# Patient Record
Sex: Male | Born: 1960 | Race: Black or African American | Hispanic: No | Marital: Single | Smoking: Never smoker
Health system: Southern US, Community
[De-identification: ages and names within clinical notes are randomized; demographics above are authoritative.]

## PROBLEM LIST (undated history)

## (undated) DIAGNOSIS — I10 Essential (primary) hypertension: Secondary | ICD-10-CM

## (undated) DIAGNOSIS — E785 Hyperlipidemia, unspecified: Secondary | ICD-10-CM

## (undated) DIAGNOSIS — K219 Gastro-esophageal reflux disease without esophagitis: Secondary | ICD-10-CM

## (undated) HISTORY — DX: Essential (primary) hypertension: I10

## (undated) HISTORY — DX: Gastro-esophageal reflux disease without esophagitis: K21.9

## (undated) HISTORY — DX: Hyperlipidemia, unspecified: E78.5

---

## 2005-05-15 ENCOUNTER — Ambulatory Visit (HOSPITAL_COMMUNITY): Admission: RE | Admit: 2005-05-15 | Discharge: 2005-05-15 | Payer: Self-pay | Admitting: Family Medicine

## 2008-10-01 ENCOUNTER — Ambulatory Visit (HOSPITAL_COMMUNITY): Admission: RE | Admit: 2008-10-01 | Discharge: 2008-10-01 | Payer: Self-pay | Admitting: Urology

## 2010-12-18 IMAGING — US US ART/VEN ABD/PELV/SCROTUM DOPPLER COMPLETE
1 series · 14 of 25 positions shown · non-contrast
Comparison: None

CLINICAL DATA: Right scrotal pain for 6 months.  Question
epididymitis.

SCROTAL ULTRASOUND
DOPPLER ULTRASOUND OF THE TESTICLES
TECHNIQUE: Complete ultrasound examination of the testicles,
epididymis, and other scrotal structures was performed.  Color and
spectral Doppler ultrasound were also utilized to evaluate blood
flow to the testicles.

[Series 1: unknown · 0.11mm/px · 14 of 38 slices shown]
[im 1/38]
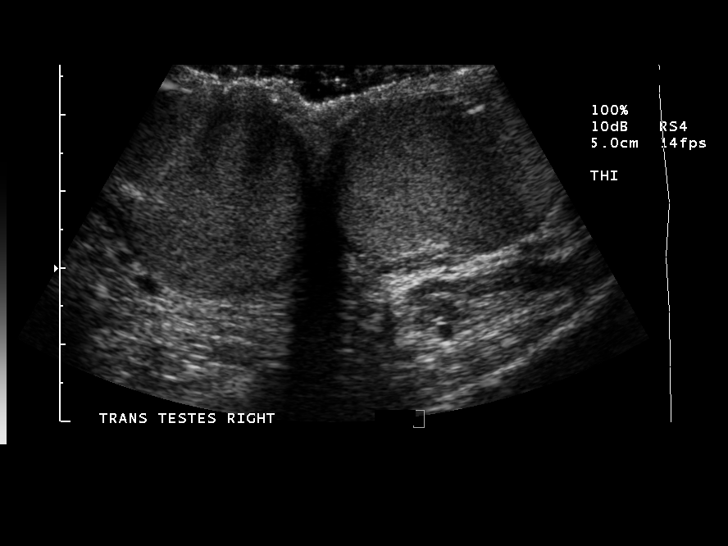
[im 4/38]
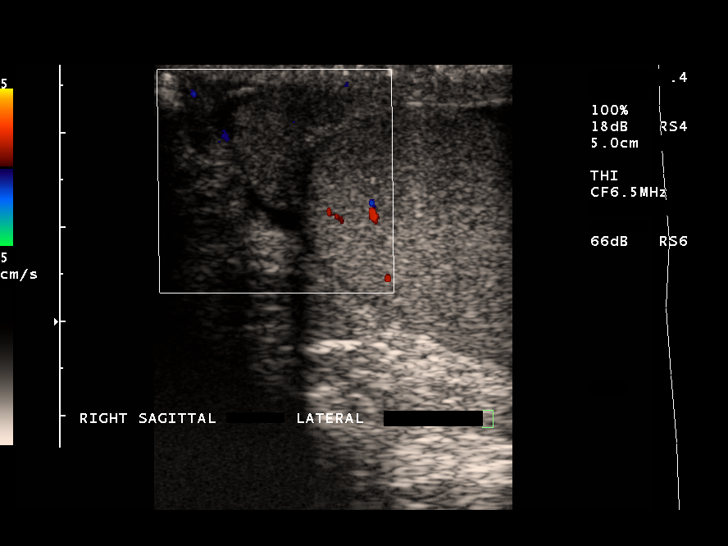
[im 7/38]
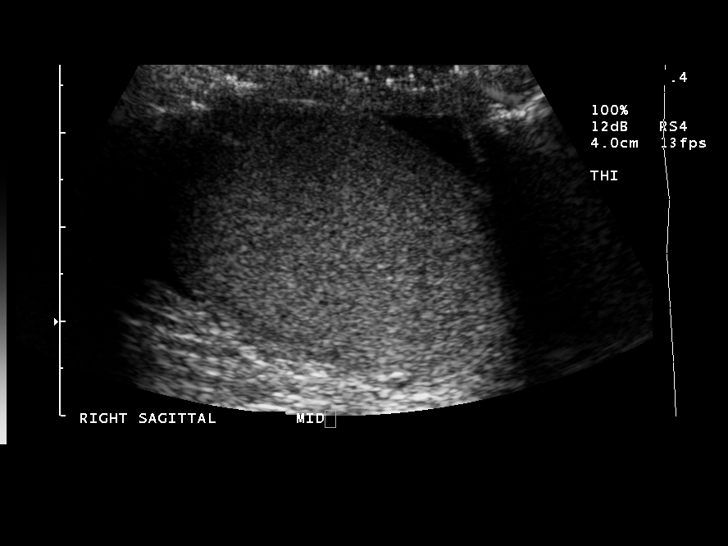
[im 10/38]
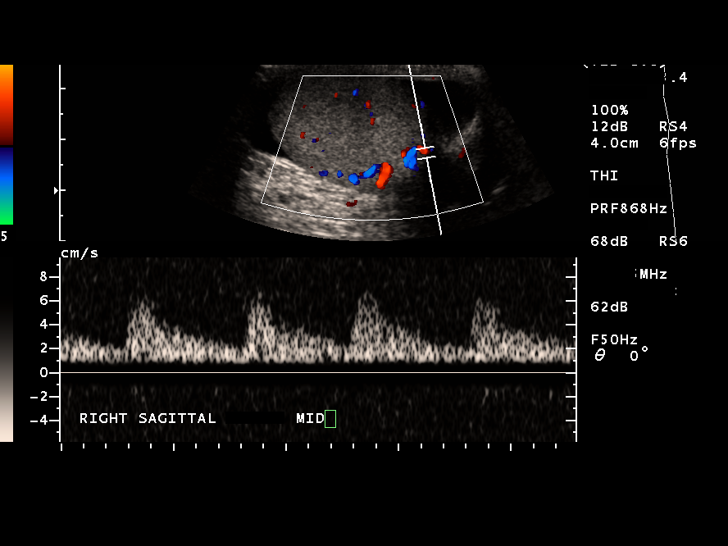
[im 13/38]
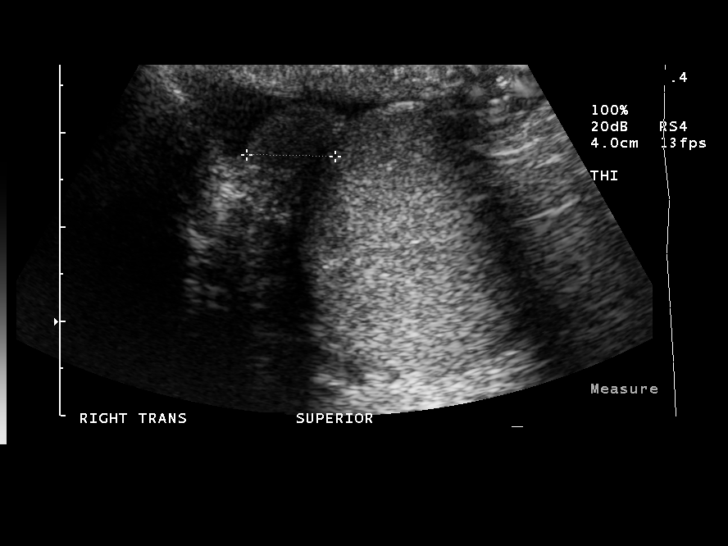
[im 14/38]
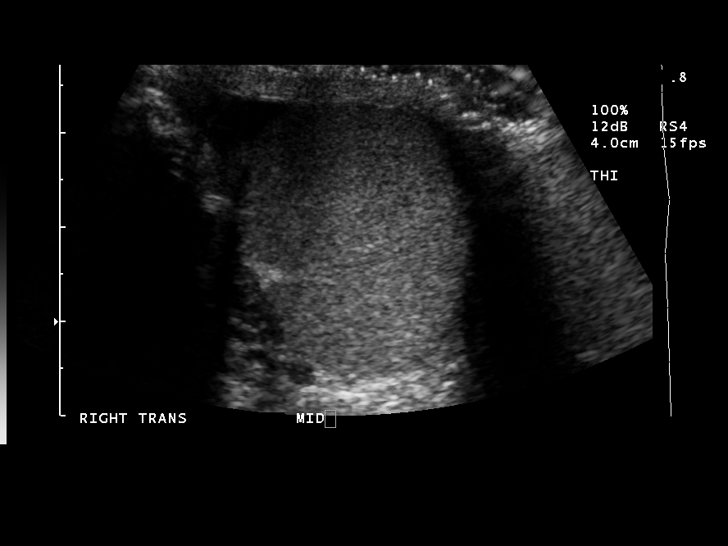
[im 17/38]
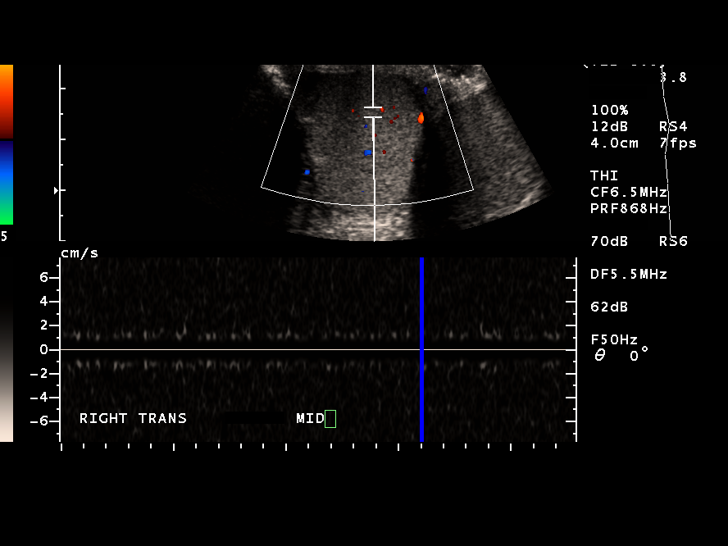
[im 21/38]
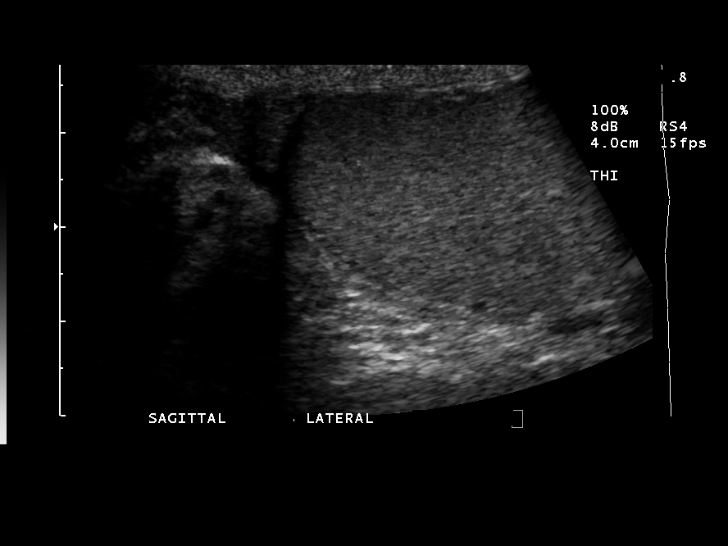
[im 24/38]
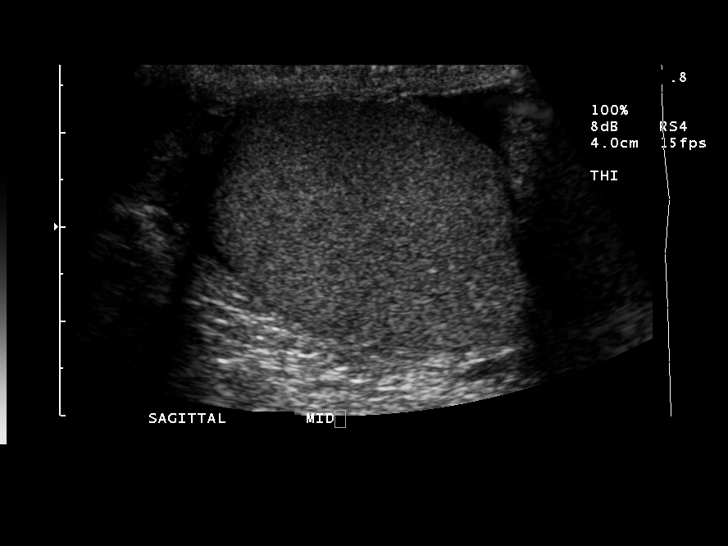
[im 25/38]
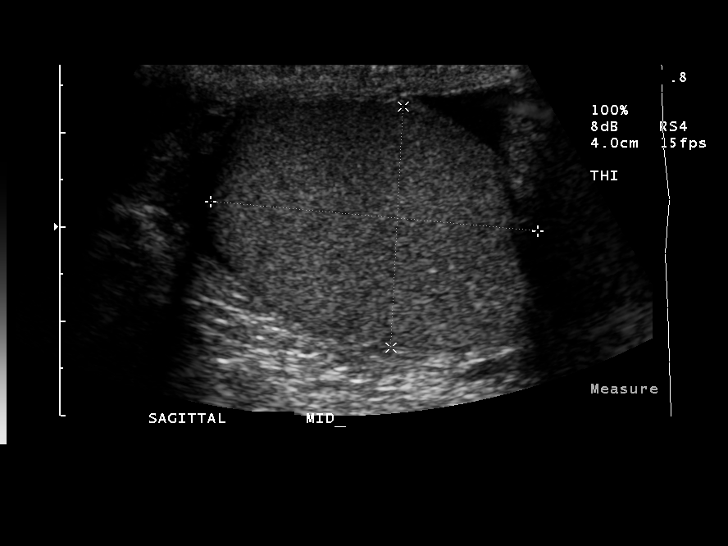
[im 28/38]
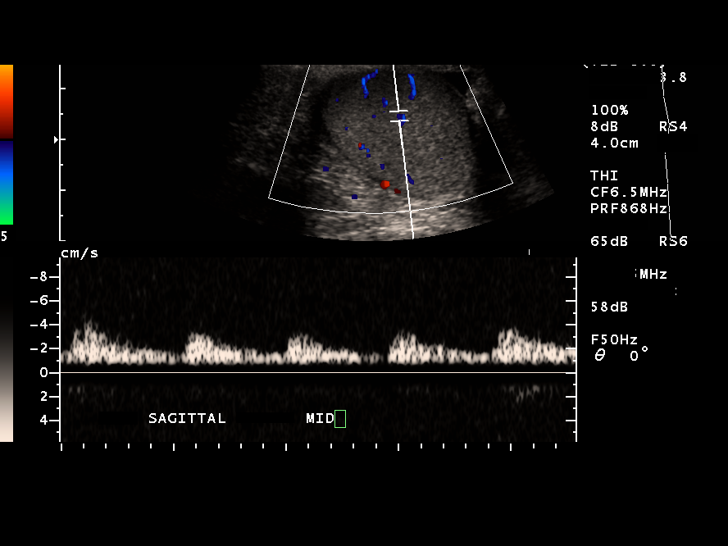
[im 31/38]
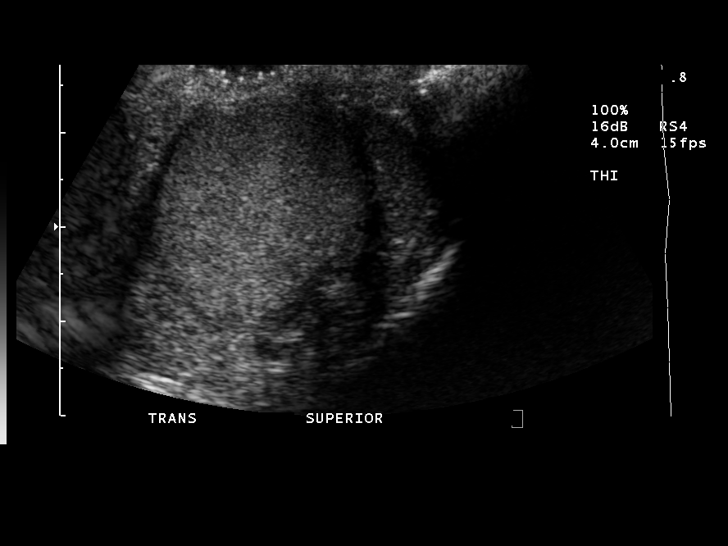
[im 34/38]
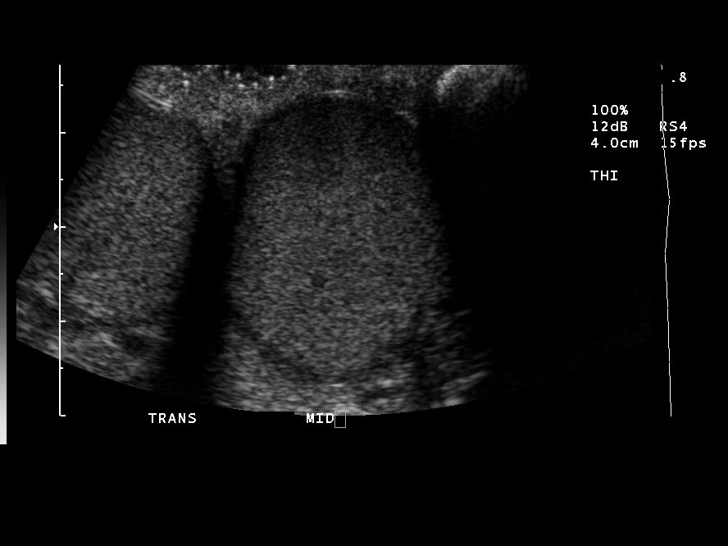
[im 38/38]
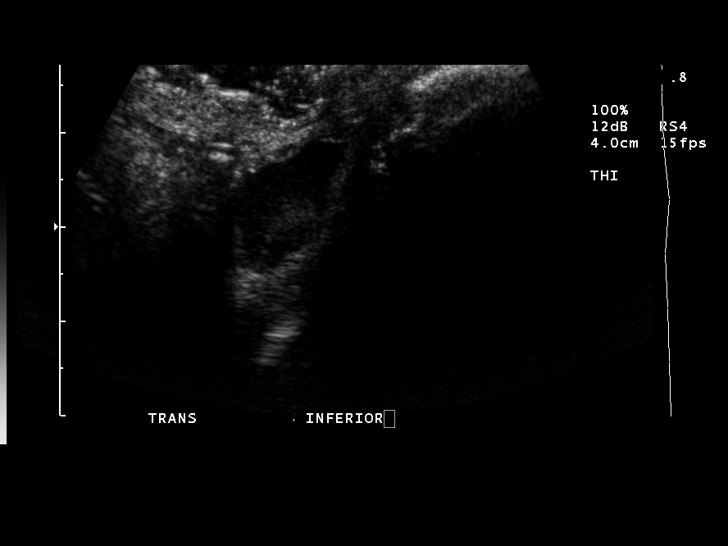

[14 of 25 positions shown; findings below may reference images not displayed]

FINDINGS: The testicles are symmetric in size and echogenicity.
The right testis measures 3.7 x 2.9 x 2.6 cm.  The left testis
measures 3.5 x 2.7 x 2.4 cm.  No testicular masses are seen, and
there is no evidence of microlithiasis.

Both epididymal heads are unremarkable in appearance.  There is no
associated hypervascularity.  Small bilateral hydroceles are noted.
There is no evidence of varicocele or other extra-testicular
abnormality.

Blood flow is seen within both testicles on color Doppler
sonography.  Doppler spectral waveforms show both arterial and
venous flow signal in both testicles.
IMPRESSION: Small bilateral hydroceles.  Otherwise normal examination without
evidence of epididymitis, testicular mass or torsion.

## 2013-11-24 ENCOUNTER — Ambulatory Visit: Payer: BC Managed Care – PPO | Attending: Neurology | Admitting: Sleep Medicine

## 2013-11-24 VITALS — Ht 68.0 in | Wt 235.0 lb

## 2013-11-24 DIAGNOSIS — G4730 Sleep apnea, unspecified: Secondary | ICD-10-CM

## 2013-11-24 DIAGNOSIS — G4710 Hypersomnia, unspecified: Secondary | ICD-10-CM

## 2013-11-24 DIAGNOSIS — R400 Somnolence: Secondary | ICD-10-CM | POA: Diagnosis present

## 2013-11-24 DIAGNOSIS — G4733 Obstructive sleep apnea (adult) (pediatric): Secondary | ICD-10-CM | POA: Diagnosis not present

## 2013-11-24 DIAGNOSIS — R0683 Snoring: Secondary | ICD-10-CM

## 2013-11-28 NOTE — Sleep Study (Signed)
  [facility] Jason Dunn A. [name], MD     [email or web address]        NOCTURNAL POLYSOMNOGRAM    LOCATION: SLEEP LAB FACILITY: [place]   PHYSICIAN: Annelyse Rey A. Gerilyn Norman, M.D.   DATE OF STUDY: 11/24/2013.   REFERRING PHYSICIAN: Harjas Biggins.   INDICATIONS: This is a 53 year old presents with fatigue, snoring and daytime sleepiness.  MEDICATIONS:  Prior to Admission medications   Not on File      EPWORTH SLEEPINESS SCALE: 2.   BMI: 36.   ARCHITECTURAL SUMMARY: Total recording time was 368 minutes. Sleep efficiency 94 %. Sleep latency 13 minutes. REM latency 69 minutes. Stage NI 4 %, N2 60 % and N3 0.5 % and REM sleep 35 %.    RESPIRATORY DATA:  Baseline oxygen saturation is 94 %. The lowest saturation is 69 % during REM sleep. The diagnostic AHI is 14. The RDI is 16. The REM AHI is 23.  LIMB MOVEMENT SUMMARY: PLM index 1.   ELECTROCARDIOGRAM SUMMARY: Average heart rate is 60 with no significant dysrhythmias observed.   IMPRESSION:  1. Moderate obstructive sleep apnea syndrome worse during REM sleep. A formal CPAP titration recording is suggestive.  Thanks for this referral.  Elma Shands A. Gerilyn Norman, M.D. Diplomat, Biomedical engineerAmerican Board of Sleep Medicine.

## 2016-07-17 ENCOUNTER — Ambulatory Visit (INDEPENDENT_AMBULATORY_CARE_PROVIDER_SITE_OTHER): Payer: BC Managed Care – PPO | Admitting: Urology

## 2016-07-17 DIAGNOSIS — N433 Hydrocele, unspecified: Secondary | ICD-10-CM

## 2016-07-17 DIAGNOSIS — N509 Disorder of male genital organs, unspecified: Secondary | ICD-10-CM | POA: Diagnosis not present

## 2017-04-02 DIAGNOSIS — I10 Essential (primary) hypertension: Secondary | ICD-10-CM | POA: Insufficient documentation

## 2020-03-22 ENCOUNTER — Other Ambulatory Visit: Payer: Self-pay

## 2020-03-22 ENCOUNTER — Ambulatory Visit (INDEPENDENT_AMBULATORY_CARE_PROVIDER_SITE_OTHER): Payer: BC Managed Care – PPO | Admitting: Urology

## 2020-03-22 ENCOUNTER — Encounter: Payer: Self-pay | Admitting: Urology

## 2020-03-22 VITALS — BP 167/93 | HR 67 | Temp 98.5°F | Ht 68.0 in | Wt 242.0 lb

## 2020-03-22 DIAGNOSIS — N50819 Testicular pain, unspecified: Secondary | ICD-10-CM

## 2020-03-22 LAB — URINALYSIS, ROUTINE W REFLEX MICROSCOPIC
Bilirubin, UA: NEGATIVE
Glucose, UA: NEGATIVE
Ketones, UA: NEGATIVE
Leukocytes,UA: NEGATIVE
Nitrite, UA: NEGATIVE
Protein,UA: NEGATIVE
RBC, UA: NEGATIVE
Specific Gravity, UA: 1.025 (ref 1.005–1.030)
Urobilinogen, Ur: 0.2 mg/dL (ref 0.2–1.0)
pH, UA: 7 (ref 5.0–7.5)

## 2020-03-22 NOTE — Progress Notes (Signed)
H&P  Chief Complaint: Pain in Testicle (R)  History of Present Illness: Jason Norman is here today for evaluation of his testicular pain. He was previously diagnosed w/ "testicular cysts" (per patient). Approximately twice per day he will feel a "catching" or "grabbing" sensation in his right testicle followed by an hour or two of achy pain (this will fluctuate between mild and severe). He notes this will be exacerbated by lifting, extended periods of sitting, and sometimes standing. He is able to obtain relief through postural adjustment. He denies any associated change in urinary pattern or bowel movements. He has never had a hernia repair and denies any back pain.  Pt did experience similar pain in 09/2008. He underwent ultrasound and evaluation at [facility] time.  IPSS Questionnaire (AUA-7): Over the past month.   1)  How often have you had a sensation of not emptying your bladder completely after you finish urinating?  2 - Less than half the time  2)  How often have you had to urinate again less than two hours after you finished urinating? 1 - Less than 1 time in 5  3)  How often have you found you stopped and started again several times when you urinated?  2 - Less than half the time  4) How difficult have you found it to postpone urination?  0 - Not at all  5) How often have you had a weak urinary stream?  0 - Not at all  6) How often have you had to push or strain to begin urination?  1 - Less than 1 time in 5  7) How many times did you most typically get up to urinate from the time you went to bed until the time you got up in the morning?  0 - None  Total score:  0-7 mildly symptomatic   8-19 moderately symptomatic   20-35 severely symptomatic  IPSS: 6 QoL Score: 5   No past medical history on file.    Home Medications:  Allergies as of 03/22/2020   No Known Allergies     Medication List       Accurate as of March 22, 2020 10:22 AM. If you have any questions, ask your nurse or doctor.         aspirin 81 MG EC tablet Take by mouth.   atorvastatin 10 MG tablet Commonly known as: LIPITOR Take 10 mg by mouth at bedtime.   ibuprofen 800 MG tablet Commonly known as: ADVIL SMARTSIG:1 Tablet(s) By Mouth Every 12 Hours PRN   lisinopril-hydrochlorothiazide 20-25 MG tablet Commonly known as: ZESTORETIC Take 1 tablet by mouth daily.   omeprazole 20 MG capsule Commonly known as: PRILOSEC Take by mouth.   tamsulosin 0.4 MG Caps capsule Commonly known as: FLOMAX Take 0.4 mg by mouth daily.       Allergies: No Known Allergies  No family history on file.  Social History:  reports that he has never smoked. He has never used smokeless tobacco. No history on file for alcohol use and drug use.  ROS: A complete review of systems was performed.  All systems are negative except for pertinent findings as noted.  Physical Exam:  Vital signs in last 24 hours: BP (!) 167/93   Pulse 67   Temp 98.5 F (36.9 C)   Ht 5\' 8"  (1.727 m)   Wt 242 lb (109.8 kg)   BMI 36.80 kg/m  Constitutional:  Alert and oriented, No acute distress Cardiovascular: Regular rate  GI: Abdomen  is soft, nontender, nondistended, no abdominal masses. No CVAT. No hernias. Genitourinary: Normal male phallus, testes are descended bilaterally and non-tender and without masses, scrotum is normal in appearance without lesions or masses, perineum is normal on inspection. Prostate feels about 40 grams. Neurologic: Grossly intact, no focal deficits Psychiatric: Normal mood and affect  I have reviewed prior pt notes  I have reviewed notes from referring/previous physicians  I have reviewed urinalysis results   Impression/Assessment:  Testicle Pain - Normal exam: pain felt to be muscular in nature and may be secondary to a non-palpable hernia given the symptom profile.  Plan:  1. Pt reassured regarding the unlikelihood of testicular cancer and advised regarding possible reasons for his testicular  pain.  2. F/U PRN  CC: Alethea Rucker

## 2020-03-22 NOTE — Progress Notes (Signed)
Urological Symptom Review  Patient is experiencing the following symptoms: Frequent urination Hard to postpone urination Stream starts and stops Trouble starting stream Erection problems (male only)   Review of Systems  Gastrointestinal (upper)  : Indigestion/heartburn  Gastrointestinal (lower) : Negative for lower GI symptoms  Constitutional : Negative for symptoms  Skin: Negative for skin symptoms  Eyes: Negative for eye symptoms  Ear/Nose/Throat : Negative for Ear/Nose/Throat symptoms  Hematologic/Lymphatic: Negative for Hematologic/Lymphatic symptoms  Cardiovascular : Negative for cardiovascular symptoms  Respiratory : Negative for respiratory symptoms  Endocrine: Negative for endocrine symptoms  Musculoskeletal: Negative for musculoskeletal symptoms  Neurological: Negative for neurological symptoms  Psychologic: Negative for psychiatric symptoms

## 2022-04-03 ENCOUNTER — Ambulatory Visit: Payer: BC Managed Care – PPO | Admitting: Family Medicine

## 2022-04-03 ENCOUNTER — Encounter: Payer: Self-pay | Admitting: Family Medicine

## 2022-04-03 DIAGNOSIS — Z130 Encounter for screening for diseases of the blood and blood-forming organs and certain disorders involving the immune mechanism: Secondary | ICD-10-CM | POA: Diagnosis not present

## 2022-04-03 DIAGNOSIS — I10 Essential (primary) hypertension: Secondary | ICD-10-CM

## 2022-04-03 DIAGNOSIS — E119 Type 2 diabetes mellitus without complications: Secondary | ICD-10-CM | POA: Diagnosis not present

## 2022-04-03 DIAGNOSIS — E785 Hyperlipidemia, unspecified: Secondary | ICD-10-CM

## 2022-04-03 DIAGNOSIS — N401 Enlarged prostate with lower urinary tract symptoms: Secondary | ICD-10-CM

## 2022-04-03 DIAGNOSIS — R3912 Poor urinary stream: Secondary | ICD-10-CM

## 2022-04-03 DIAGNOSIS — N400 Enlarged prostate without lower urinary tract symptoms: Secondary | ICD-10-CM | POA: Insufficient documentation

## 2022-04-03 DIAGNOSIS — R972 Elevated prostate specific antigen [PSA]: Secondary | ICD-10-CM | POA: Diagnosis not present

## 2022-04-03 DIAGNOSIS — K219 Gastro-esophageal reflux disease without esophagitis: Secondary | ICD-10-CM | POA: Insufficient documentation

## 2022-04-03 NOTE — Patient Instructions (Signed)
Referral to Urology placed.  Continue your medications.  Labs today.  Follow up in 6 months.

## 2022-04-03 NOTE — Progress Notes (Signed)
Subjective:  Patient ID: Jason Norman, male    DOB: 1960-08-20  Age: 62 y.o. MRN: [id number]  CC: Chief Complaint  Patient presents with   Establish Care    HTN, hypelipidemia    HPI:  62 year old male presents to establish care.  Labs reflect that patient has underlying type 2 diabetes.  He has had prior A1c of 6.7 in July 2023.  He has made lifestyle changes and his most recent A1c was 6.1.  He is on no pharmacotherapy regarding type 2 diabetes at this time.  Needs labs today including urine microalbumin.  Hypertension is well-controlled on lisinopril/HCTZ.  Patient's hyperlipidemia has been well-controlled on atorvastatin.  Most recent LDL was 72.  No reported side effects.  Patient reports that he has underlying BPH.  Has weak stream and nocturia.  He states that he gets up twice at night.  He is currently on Flomax.  He has had elevated PSAs.  Most recent PSA was 4.35.  He is not seeing a urologist.  Patient Active Problem List   Diagnosis Date Noted   GERD (gastroesophageal reflux disease) 04/03/2022   Type 2 diabetes mellitus without complications ([place]) [id number]   BPH (benign prostatic hyperplasia) 04/03/2022   Elevated PSA 04/03/2022   Hyperlipidemia 04/03/2022   Essential hypertension 04/02/2017    Social Hx   Social History   Socioeconomic History   Marital status: Single    Spouse name: Not on file   Number of children: Not on file   Years of education: Not on file   Highest education level: Not on file  Occupational History   Not on file  Tobacco Use   Smoking status: Never   Smokeless tobacco: Never  Substance and Sexual Activity   Alcohol use: Never   Drug use: Never   Sexual activity: Yes  Other Topics Concern   Not on file  Social History Narrative   Not on file   Social Determinants of Health   Financial Resource Strain: Not on file  Food Insecurity: Not on file  Transportation Needs: Not on file  Physical Activity: Not on file   Stress: Not on file  Social Connections: Not on file    Review of Systems Per HPI  Objective:  BP 118/72   Pulse 77   Temp (!) 97.2 F (36.2 C)   Ht 5' 8"$  (1.727 m)   Wt 243 lb (110.2 kg)   SpO2 96%   BMI 36.95 kg/m      04/03/2022    9:18 AM 03/22/2020   10:20 AM 11/24/2013   10:49 PM  BP/Weight  Systolic BP [id number] [id number]   Diastolic BP 72 93   Wt. (Lbs) 243 242 235  BMI 36.95 kg/m2 36.8 kg/m2 35.73 kg/m2    Physical Exam Vitals and nursing note reviewed.  Constitutional:      General: He is not in acute distress.    Appearance: Normal appearance.  HENT:     Head: Normocephalic and atraumatic.  Eyes:     General:        Right eye: No discharge.        Left eye: No discharge.     Conjunctiva/sclera: Conjunctivae normal.  Cardiovascular:     Rate and Rhythm: Normal rate and regular rhythm.  Pulmonary:     Effort: Pulmonary effort is normal.     Breath sounds: Normal breath sounds. No wheezing, rhonchi or rales.  Neurological:     Mental Status: He is alert.  Psychiatric:        Mood and Affect: Mood normal.        Behavior: Behavior normal.     Assessment & Plan:   Problem List Items Addressed This Visit       Cardiovascular and Mediastinum   Essential hypertension    Stable on lisinopril/HCTZ.  Continue.  Labs today.        Endocrine   Type 2 diabetes mellitus without complications ([place])    Reassessing today with A1c.  No pharmacotherapy at this time.      Relevant Orders   CMP14+EGFR   Hemoglobin A1c   Microalbumin / creatinine urine ratio     Genitourinary   BPH (benign prostatic hyperplasia)    Continue Flomax.      Relevant Orders   Ambulatory referral to Urology     Other   Hyperlipidemia    Well-controlled.  Continue Lipitor.      Relevant Orders   Lipid panel   Elevated PSA    Referring to urology      Relevant Orders   PSA   Ambulatory referral to Urology   Other Visit Diagnoses     Screening for deficiency anemia        Relevant Orders   CBC      Follow-up:  6 months  [place]

## 2022-04-03 NOTE — Assessment & Plan Note (Signed)
-   Continue Flomax 

## 2022-04-03 NOTE — Assessment & Plan Note (Signed)
Reassessing today with A1c.  No pharmacotherapy at this time.

## 2022-04-03 NOTE — Assessment & Plan Note (Signed)
Referring to urology.

## 2022-04-03 NOTE — Assessment & Plan Note (Addendum)
Well-controlled.  Continue Lipitor.

## 2022-04-03 NOTE — Assessment & Plan Note (Addendum)
Stable on lisinopril/HCTZ.  Continue.  Labs today.

## 2022-04-04 LAB — LIPID PANEL
Chol/HDL Ratio: 5.3 ratio — ABNORMAL HIGH (ref 0.0–5.0)
Cholesterol, Total: 186 mg/dL (ref 100–199)
HDL: 35 mg/dL — ABNORMAL LOW (ref >39)
LDL Chol Calc (NIH): 102 mg/dL — ABNORMAL HIGH (ref 0–99)
Triglycerides: 285 mg/dL — ABNORMAL HIGH (ref 0–149)
VLDL Cholesterol Cal: 49 mg/dL — ABNORMAL HIGH (ref 5–40)

## 2022-04-04 LAB — CMP14+EGFR
ALT: 17 IU/L (ref 0–44)
AST: 18 IU/L (ref 0–40)
Albumin/Globulin Ratio: 1.8 (ref 1.2–2.2)
Albumin: 4.6 g/dL (ref 3.9–4.9)
Alkaline Phosphatase: 63 IU/L (ref 44–121)
BUN/Creatinine Ratio: 13 (ref 10–24)
BUN: 14 mg/dL (ref 8–27)
Bilirubin Total: 1 mg/dL (ref 0.0–1.2)
CO2: 24 mmol/L (ref 20–29)
Calcium: 10.2 mg/dL (ref 8.6–10.2)
Chloride: 101 mmol/L (ref 96–106)
Creatinine, Ser: 1.1 mg/dL (ref 0.76–1.27)
Globulin, Total: 2.5 g/dL (ref 1.5–4.5)
Glucose: 110 mg/dL — ABNORMAL HIGH (ref 70–99)
Potassium: 4.4 mmol/L (ref 3.5–5.2)
Sodium: 139 mmol/L (ref 134–144)
Total Protein: 7.1 g/dL (ref 6.0–8.5)
eGFR: 76 mL/min/{1.73_m2} (ref >59)

## 2022-04-04 LAB — CBC
Hematocrit: 43.3 % (ref 37.5–51.0)
Hemoglobin: 15 g/dL (ref 13.0–17.7)
MCH: 29.2 pg (ref 26.6–33.0)
MCHC: 34.6 g/dL (ref 31.5–35.7)
MCV: 84 fL (ref 79–97)
Platelets: 456 10*3/uL — ABNORMAL HIGH (ref 150–450)
RBC: 5.14 x10E6/uL (ref 4.14–5.80)
RDW: 13.2 % (ref 11.6–15.4)
WBC: 4.4 10*3/uL (ref 3.4–10.8)

## 2022-04-04 LAB — HEMOGLOBIN A1C
Est. average glucose Bld gHb Est-mCnc: 128 mg/dL
Hgb A1c MFr Bld: 6.1 % — ABNORMAL HIGH (ref 4.8–5.6)

## 2022-04-04 LAB — PSA: Prostate Specific Ag, Serum: 4.5 ng/mL — ABNORMAL HIGH (ref 0.0–4.0)

## 2022-04-04 LAB — MICROALBUMIN / CREATININE URINE RATIO
Creatinine, Urine: 166.4 mg/dL
Microalb/Creat Ratio: 3 mg/g creat (ref 0–29)
Microalbumin, Urine: 5.1 ug/mL

## 2022-04-06 ENCOUNTER — Other Ambulatory Visit: Payer: Self-pay

## 2022-04-06 DIAGNOSIS — E785 Hyperlipidemia, unspecified: Secondary | ICD-10-CM

## 2022-04-06 MED ORDER — ROSUVASTATIN CALCIUM 20 MG PO TABS: 20.0000 mg | ORAL_TABLET | Freq: Every day | ORAL | 2 refills | Status: DC

## 2022-05-14 NOTE — Progress Notes (Signed)
H&P  Chief Complaint: Urinary difficulty  History of Present Illness: 62 year old male, seen 2 years ago for abnormal testicular exam, presents today referred by Dr. Elliot Gurney for evaluation and management of lower urinary tract symptoms.  Past Medical History:  Diagnosis Date   GERD (gastroesophageal reflux disease)    Hyperlipidemia    Hypertension     No past surgical history on file.  Home Medications:  Allergies as of 05/15/2022   No Known Allergies      Medication List        Accurate as of May 14, 2022  7:50 PM. If you have any questions, ask your nurse or doctor.          aspirin EC 81 MG tablet Take by mouth.   atorvastatin 10 MG tablet Commonly known as: LIPITOR Take 10 mg by mouth at bedtime.   lisinopril-hydrochlorothiazide 20-25 MG tablet Commonly known as: ZESTORETIC Take 1 tablet by mouth daily.   omeprazole 20 MG capsule Commonly known as: PRILOSEC Take by mouth.   rosuvastatin 20 MG tablet Commonly known as: Crestor Take 1 tablet (20 mg total) by mouth daily.   tamsulosin 0.4 MG Caps capsule Commonly known as: FLOMAX Take 0.4 mg by mouth daily.        Allergies: No Known Allergies  Family History  Problem Relation Age of Onset   Diabetes Mother    Diabetes Father     Social History:  reports that he has never smoked. He has never used smokeless tobacco. He reports that he does not drink alcohol and does not use drugs.  ROS: A complete review of systems was performed.  All systems are negative except for pertinent findings as noted.  Physical Exam:  Vital signs in last 24 hours: There were no vitals taken for this visit. Constitutional:  Alert and oriented, No acute distress Cardiovascular: Regular rate  Respiratory: Normal respiratory effort GI: Abdomen is soft, nontender, nondistended, no abdominal masses. No CVAT.  Genitourinary: Normal male phallus, testes are descended bilaterally and non-tender and without masses,  scrotum is normal in appearance without lesions or masses, perineum is normal on inspection. Lymphatic: No lymphadenopathy Neurologic: Grossly intact, no focal deficits Psychiatric: Normal mood and affect  I have reviewed prior pt notes  I have reviewed notes from referring/previous physicians  I have reviewed urinalysis results  I have independently reviewed prior imaging  I have reviewed prior PSA results  I have reviewed prior urine culture   Impression/Assessment:  ***  Plan:  ***

## 2022-05-15 ENCOUNTER — Ambulatory Visit: Payer: BC Managed Care – PPO | Admitting: Urology

## 2022-05-15 ENCOUNTER — Encounter: Payer: Self-pay | Admitting: Urology

## 2022-05-15 VITALS — BP 145/90 | HR 76 | Ht 68.0 in | Wt 239.0 lb

## 2022-05-15 DIAGNOSIS — R3912 Poor urinary stream: Secondary | ICD-10-CM

## 2022-05-15 DIAGNOSIS — N401 Enlarged prostate with lower urinary tract symptoms: Secondary | ICD-10-CM

## 2022-05-15 DIAGNOSIS — R972 Elevated prostate specific antigen [PSA]: Secondary | ICD-10-CM

## 2022-05-15 LAB — URINALYSIS, ROUTINE W REFLEX MICROSCOPIC
Bilirubin, UA: NEGATIVE
Glucose, UA: NEGATIVE
Ketones, UA: NEGATIVE
Leukocytes,UA: NEGATIVE
Nitrite, UA: NEGATIVE
Protein,UA: NEGATIVE
RBC, UA: NEGATIVE
Specific Gravity, UA: 1.025 (ref 1.005–1.030)
Urobilinogen, Ur: 0.2 mg/dL (ref 0.2–1.0)
pH, UA: 5.5 (ref 5.0–7.5)

## 2022-06-19 ENCOUNTER — Other Ambulatory Visit: Payer: BC Managed Care – PPO

## 2022-06-20 LAB — PSA: Prostate Specific Ag, Serum: 4.6 ng/mL — ABNORMAL HIGH (ref 0.0–4.0)

## 2022-06-24 NOTE — Progress Notes (Signed)
History of Present Illness:   3.26.2024: 62 year old male, seen 2 years ago for abnormal testicular exam, presents today referred by Dr. Cherlyn Cushing for evaluation and management of LUTS.   He has been on tamsulosin since his first visit 2 years ago.  He states that his urinary symptoms have been getting worse.  Currently, biggest issue is slow stream-that is, unless he waits longer to urinate at which time it does hurt some but he has a better stream then.  IPSS 14, quality-of-life score 3.  He does drink a fair amount of tea, coffee in the morning and a soda or 2 in the afternoon.   Recent PSA was 4.5.  He was felt to have OAB--OAB guidesheet, Myrbetriq 50 mg samples given.  5.7.2024: He is here today for follow-up.  With the Myrbetriq as well as dietary modification, he has actually been doing well.  He is holding his urine better and urgency is decreased. IPSS 13, quality-of-life score still 5.  PSA from April was 4.6      Past Medical History:  Diagnosis Date   GERD (gastroesophageal reflux disease)    Hyperlipidemia    Hypertension     No past surgical history on file.  Home Medications:  Allergies as of 06/26/2022   No Known Allergies      Medication List        Accurate as of Jun 24, 2022  4:46 PM. If you have any questions, ask your nurse or doctor.          aspirin EC 81 MG tablet Take by mouth.   atorvastatin 10 MG tablet Commonly known as: LIPITOR Take 10 mg by mouth at bedtime.   lisinopril-hydrochlorothiazide 20-25 MG tablet Commonly known as: ZESTORETIC Take 1 tablet by mouth daily.   omeprazole 20 MG capsule Commonly known as: PRILOSEC Take by mouth.   rosuvastatin 20 MG tablet Commonly known as: Crestor Take 1 tablet (20 mg total) by mouth daily.   tamsulosin 0.4 MG Caps capsule Commonly known as: FLOMAX Take 0.4 mg by mouth daily.        Allergies: No Known Allergies  Family History  Problem Relation Age of Onset   Diabetes  Mother    Diabetes Father     Social History:  reports that he has never smoked. He has never used smokeless tobacco. He reports that he does not drink alcohol and does not use drugs.  ROS: A complete review of systems was performed.  All systems are negative except for pertinent findings as noted.  Physical Exam:  Vital signs in last 24 hours: There were no vitals taken for this visit. Constitutional:  Alert and oriented, No acute distress Cardiovascular: Regular rate  Respiratory: Normal respiratory effort Neurologic: Grossly intact, no focal deficits Psychiatric: Normal mood and affect  I have reviewed prior pt notes  I have reviewed urinalysis results  I have independently reviewed prior imaging-will either scan  I have reviewed IPSS results  PSA reviewed   Impression/Assessment:  1.  BPH with symptoms although I think most of his symptoms are due to OAB, he empties well  2.  OAB, improved with behavioral modification and Myrbetriq  Plan:  1.  I will give more samples of Myrbetriq 50 mg, take every other day  2.  Continue to limit caffeine  3.  Try weaning off of tamsulosin-he was given written instructions on this as well as to take the Myrbetriq every other day  4.  I will  see back in 3 to 4 months for recheck

## 2022-06-26 ENCOUNTER — Encounter: Payer: Self-pay | Admitting: Urology

## 2022-06-26 ENCOUNTER — Ambulatory Visit: Payer: BC Managed Care – PPO | Admitting: Urology

## 2022-06-26 VITALS — BP 127/59 | HR 65 | Ht 68.0 in | Wt 239.0 lb

## 2022-06-26 DIAGNOSIS — R350 Frequency of micturition: Secondary | ICD-10-CM

## 2022-06-26 DIAGNOSIS — N401 Enlarged prostate with lower urinary tract symptoms: Secondary | ICD-10-CM

## 2022-06-26 DIAGNOSIS — R972 Elevated prostate specific antigen [PSA]: Secondary | ICD-10-CM

## 2022-06-26 DIAGNOSIS — R3912 Poor urinary stream: Secondary | ICD-10-CM

## 2022-06-26 LAB — URINALYSIS, ROUTINE W REFLEX MICROSCOPIC
Bilirubin, UA: NEGATIVE
Glucose, UA: NEGATIVE
Leukocytes,UA: NEGATIVE
Nitrite, UA: NEGATIVE
Protein,UA: NEGATIVE
RBC, UA: NEGATIVE
Specific Gravity, UA: 1.025 (ref 1.005–1.030)
Urobilinogen, Ur: 2 mg/dL — ABNORMAL HIGH (ref 0.2–1.0)
pH, UA: 5.5 (ref 5.0–7.5)

## 2022-06-26 LAB — BLADDER SCAN AMB NON-IMAGING: Scan Result: 10

## 2022-06-26 NOTE — Progress Notes (Signed)
post void residual=10 °

## 2022-07-28 ENCOUNTER — Other Ambulatory Visit: Payer: Self-pay | Admitting: Family Medicine

## 2022-07-28 DIAGNOSIS — E785 Hyperlipidemia, unspecified: Secondary | ICD-10-CM

## 2022-07-30 ENCOUNTER — Telehealth: Payer: Self-pay | Admitting: Family Medicine

## 2022-07-30 DIAGNOSIS — E785 Hyperlipidemia, unspecified: Secondary | ICD-10-CM

## 2022-07-30 MED ORDER — ROSUVASTATIN CALCIUM 20 MG PO TABS: 20.0000 mg | ORAL_TABLET | Freq: Every day | ORAL | 1 refills | Status: AC

## 2022-07-30 NOTE — Telephone Encounter (Signed)
Refill on rosuvastatin (CRESTOR) 20 MG tablet send to [facility]

## 2022-10-02 ENCOUNTER — Ambulatory Visit: Payer: BC Managed Care – PPO | Admitting: Family Medicine

## 2022-10-02 VITALS — BP 137/84 | HR 65 | Temp 97.3°F | Ht 68.0 in | Wt 242.0 lb

## 2022-10-02 DIAGNOSIS — I10 Essential (primary) hypertension: Secondary | ICD-10-CM | POA: Diagnosis not present

## 2022-10-02 DIAGNOSIS — E119 Type 2 diabetes mellitus without complications: Secondary | ICD-10-CM

## 2022-10-02 DIAGNOSIS — E785 Hyperlipidemia, unspecified: Secondary | ICD-10-CM | POA: Diagnosis not present

## 2022-10-02 DIAGNOSIS — Z130 Encounter for screening for diseases of the blood and blood-forming organs and certain disorders involving the immune mechanism: Secondary | ICD-10-CM

## 2022-10-02 MED ORDER — LISINOPRIL-HYDROCHLOROTHIAZIDE 20-25 MG PO TABS: 1.0000 | ORAL_TABLET | Freq: Every day | ORAL | 3 refills | Status: DC

## 2022-10-02 NOTE — Assessment & Plan Note (Signed)
Reassessing control today with lipid panel.  Continue Crestor.

## 2022-10-02 NOTE — Assessment & Plan Note (Signed)
Stable. Continue lisinopril-HCTZ

## 2022-10-02 NOTE — Progress Notes (Signed)
Subjective:  Patient ID: Jason Norman, male    DOB: 15-Feb-1961  Age: 62 y.o. MRN: [id number]  CC: Chief Complaint  Patient presents with   Diabetes    HPI:  62 year old male with diet-controlled type 2 diabetes, hypertension, GERD, BPH and elevated PSA, and hyperlipidemia presents for follow-up.  Patient reports that he is doing well.  No chest pain or shortness of breath.  No vision or hearing issues.  No abdominal pain.  Good appetite.  Patient blood pressure is fairly well-controlled on lisinopril/HCTZ.  Patient has had prior A1c's that have been elevated in the diabetic range.  Last A1c was 6.1.  He is diet controlled.  Needs labs including A1c today.  Previous lipid panel revealed an LDL of 102.  He is on Crestor 20 mg daily.  Tolerating.  Needs lipid panel today.  We discussed his preventative health care items today.  These were updated in the health maintenance section.  Patient Active Problem List   Diagnosis Date Noted   GERD (gastroesophageal reflux disease) 04/03/2022   Diet-controlled type 2 diabetes mellitus ([facility]) 04/03/2022   BPH (benign prostatic hyperplasia) 04/03/2022   Elevated PSA 04/03/2022   Hyperlipidemia 04/03/2022   Essential hypertension 04/02/2017    Social Hx   Social History   Socioeconomic History   Marital status: Single    Spouse name: Not on file   Number of children: Not on file   Years of education: Not on file   Highest education level: Not on file  Occupational History   Not on file  Tobacco Use   Smoking status: Never   Smokeless tobacco: Never  Substance and Sexual Activity   Alcohol use: Never   Drug use: Never   Sexual activity: Yes  Other Topics Concern   Not on file  Social History Narrative   Not on file   Social Determinants of Health   Financial Resource Strain: Low Risk  (09/11/2021)   Received from [facility], [facility]   Overall Financial Resource Strain (CARDIA)    Difficulty of Paying Living  Expenses: Not hard at all  Food Insecurity: No Food Insecurity (09/11/2021)   Received from [facility], [facility]   Hunger Vital Sign    Worried About Running Out of Food in the Last Year: Never true    Ran Out of Food in the Last Year: Never true  Transportation Needs: No Transportation Needs (06/27/2021)   Received from [facility], [facility]   [facility] - Transportation    Lack of Transportation (Medical): No    Lack of Transportation (Non-Medical): No  Physical Activity: Not on file  Stress: Not on file  Social Connections: Not on file    Review of Systems Per HPI  Objective:  BP 137/84   Pulse 65   Temp (!) 97.3 F (36.3 C)   Ht 5\' 8"  (1.727 m)   Wt 242 lb (109.8 kg)   SpO2 95%   BMI 36.80 kg/m      10/02/2022    8:20 AM 06/26/2022   10:42 AM 05/15/2022    9:56 AM  BP/Weight  Systolic BP 137 127 145  Diastolic BP 84 59 90  Wt. (Lbs) 242 239   BMI 36.8 kg/m2 36.34 kg/m2     Physical Exam Vitals and nursing note reviewed.  Constitutional:      General: He is not in acute distress.    Appearance: Normal appearance.  HENT:  Head: Normocephalic and atraumatic.  Eyes:     General:        Right eye: No discharge.        Left eye: No discharge.     Conjunctiva/sclera: Conjunctivae normal.  Cardiovascular:     Rate and Rhythm: Normal rate and regular rhythm.  Pulmonary:     Effort: Pulmonary effort is normal.     Breath sounds: Normal breath sounds. No wheezing, rhonchi or rales.  Neurological:     Mental Status: He is alert.  Psychiatric:        Mood and Affect: Mood normal.        Behavior: Behavior normal.     Lab Results  Component Value Date   WBC 4.4 04/03/2022   HGB 15.0 04/03/2022   HCT 43.3 04/03/2022   PLT 456 (H) 04/03/2022   GLUCOSE 110 (H) 04/03/2022   CHOL 186 04/03/2022   TRIG 285 (H) 04/03/2022   HDL 35 (L) 04/03/2022   LDLCALC 102 (H) 04/03/2022   ALT 17 04/03/2022   AST 18 04/03/2022   NA 139 04/03/2022   K  4.4 04/03/2022   CL 101 04/03/2022   CREATININE 1.10 04/03/2022   BUN 14 04/03/2022   CO2 24 04/03/2022   HGBA1C 6.1 (H) 04/03/2022     Assessment & Plan:   Problem List Items Addressed This Visit       Cardiovascular and Mediastinum   Essential hypertension - Primary    Stable.  Continue lisinopril/HCTZ.      Relevant Medications   lisinopril-hydrochlorothiazide (ZESTORETIC) 20-25 MG tablet     Endocrine   Diet-controlled type 2 diabetes mellitus (H[facility])    Has been stable.  A1c today.      Relevant Medications   lisinopril-hydrochlorothiazide (ZESTORETIC) 20-25 MG tablet   Other Relevant Orders   CMP14+EGFR   Hemoglobin A1c   Microalbumin / creatinine urine ratio     Other   Hyperlipidemia    Reassessing control today with lipid panel.  Continue Crestor.      Relevant Medications   lisinopril-hydrochlorothiazide (ZESTORETIC) 20-25 MG tablet   Other Relevant Orders   Lipid panel   Other Visit Diagnoses     Screening for deficiency anemia       Relevant Orders   CBC       Meds ordered this encounter  Medications   lisinopril-hydrochlorothiazide (ZESTORETIC) 20-25 MG tablet    Sig: Take 1 tablet by mouth daily.    Dispense:  90 tablet    Refill:  3    Follow-up:  Patient to schedule a physical.   Everlene Other DO [facility]

## 2022-10-02 NOTE — Patient Instructions (Signed)
Continue your medications.  Labs today.  Consider the preventative items we discussed.

## 2022-10-02 NOTE — Assessment & Plan Note (Signed)
Has been stable. A1c today. 

## 2022-10-02 NOTE — Progress Notes (Signed)
  History of Present Illness:   8.14.2024: Here for follow-up of LUTS (frequency, urgency, slow stream).  Also has had successive PSAs of 4.5 and 4.6.  At visit in May of this year, he was given overactive bladder instructions, continued on Myrbetriq.  I instructed him to try weaning off of tamsulosin. Past Medical History:  Diagnosis Date   GERD (gastroesophageal reflux disease)    Hyperlipidemia    Hypertension     No past surgical history on file.  Home Medications:  Allergies as of 10/03/2022   No Known Allergies      Medication List        Accurate as of October 02, 2022 12:40 PM. If you have any questions, ask your nurse or doctor.          aspirin EC 81 MG tablet Take by mouth.   lisinopril-hydrochlorothiazide 20-25 MG tablet Commonly known as: ZESTORETIC Take 1 tablet by mouth daily.   omeprazole 20 MG capsule Commonly known as: PRILOSEC Take by mouth.   rosuvastatin 20 MG tablet Commonly known as: CRESTOR Take 1 tablet (20 mg total) by mouth daily.        Allergies: No Known Allergies  Family History  Problem Relation Age of Onset   Diabetes Mother    Diabetes Father     Social History:  reports that he has never smoked. He has never used smokeless tobacco. He reports that he does not drink alcohol and does not use drugs.  ROS: A complete review of systems was performed.  All systems are negative except for pertinent findings as noted.  Physical Exam:  Vital signs in last 24 hours: There were no vitals taken for this visit. Constitutional:  Alert and oriented, No acute distress Cardiovascular: Regular rate  Respiratory: Normal respiratory effort GI: Abdomen is soft, nontender, nondistended, no abdominal masses. No CVAT.  Genitourinary: Normal male phallus, testes are descended bilaterally and non-tender and without masses, scrotum is normal in appearance without lesions or masses, perineum is normal on inspection. Lymphatic: No  lymphadenopathy Neurologic: Grossly intact, no focal deficits Psychiatric: Normal mood and affect  I have reviewed prior pt notes  I have reviewed urinalysis results  I have independently reviewed prior imaging-bladder scans  I have reviewed prior PSA results    Impression/Assessment:  ***  Plan:  ***

## 2022-10-03 ENCOUNTER — Ambulatory Visit: Payer: BC Managed Care – PPO | Admitting: Urology

## 2022-10-03 ENCOUNTER — Encounter: Payer: Self-pay | Admitting: Urology

## 2022-10-03 VITALS — BP 126/78 | HR 69

## 2022-10-03 DIAGNOSIS — R350 Frequency of micturition: Secondary | ICD-10-CM

## 2022-10-03 DIAGNOSIS — N401 Enlarged prostate with lower urinary tract symptoms: Secondary | ICD-10-CM

## 2022-10-03 DIAGNOSIS — R3912 Poor urinary stream: Secondary | ICD-10-CM | POA: Diagnosis not present

## 2022-10-03 DIAGNOSIS — R972 Elevated prostate specific antigen [PSA]: Secondary | ICD-10-CM

## 2022-10-03 LAB — URINALYSIS, ROUTINE W REFLEX MICROSCOPIC
Bilirubin, UA: NEGATIVE
Glucose, UA: NEGATIVE
Ketones, UA: NEGATIVE
Leukocytes,UA: NEGATIVE
Nitrite, UA: NEGATIVE
Protein,UA: NEGATIVE
RBC, UA: NEGATIVE
Specific Gravity, UA: 1.03 (ref 1.005–1.030)
Urobilinogen, Ur: 2 mg/dL — ABNORMAL HIGH (ref 0.2–1.0)
pH, UA: 6 (ref 5.0–7.5)

## 2022-10-03 MED ORDER — LEVOFLOXACIN 750 MG PO TABS: 750.0000 mg | ORAL_TABLET | Freq: Every day | ORAL | 0 refills | Status: AC

## 2022-10-03 NOTE — Patient Instructions (Addendum)
   Appointment Time: 8:15 AM Appointment Date: September 10,2024  Location: [facility]   Prostate Biopsy Instructions  Stop all aspirin or blood thinners (aspirin, plavix, coumadin, warfarin, motrin, ibuprofen, advil, aleve, naproxen, naprosyn) for 7 days prior to the procedure.  If you have any questions about stopping these medications, please contact your primary care physician or cardiologist.  Having a light meal prior to the procedure is recommended.  If you are diabetic or have low blood sugar please bring a small snack or glucose tablet.  A Fleets enema is needed to be purchased over the counter at a local pharmacy and used 2 hours before you scheduled appointment.  This can be purchased over the counter at any pharmacy.  Antibiotics will be administered in the clinic at the time of the procedure and 1 tablet has been sent to your pharmacy. Please take the antibiotic as prescribed.    Please bring someone with you to the procedure to drive you home if you are given a valium to take prior to your procedure.   If you have any questions or concerns, please feel free to call the office at [phone] or send a Mychart message.    Thank you, [facility]

## 2022-10-09 ENCOUNTER — Ambulatory Visit: Payer: BC Managed Care – PPO | Admitting: Urology

## 2022-11-08 NOTE — Progress Notes (Signed)
Risks, benefits, and some of the potential complications of a transrectal ultrasounds of the prostate (TRUSP) with biopsies were discussed at length with the patient including gross hematuria, blood in the bowel movements, hematospermia, bacteremia, infection, voiding discomfort, urinary retention, fever, chills, sepsis, blood transfusion, death, and others. All questions were answered. Informed consent was obtained. The patient confirmed that he had taken his pre-procedure antibiotic. All anticoagulants were discontinued prior to the procedure. The patient emptied his bladder. He was positioned in a comfortable left lateral decubitus position with hips and knees acutely flexed.  The rectal probe was inserted into the rectum without difficulty. 10cc of 2% Lidocaine without epinephrine was instilled with a spinal needle using ultrasound guidance near the junction of each seminal vesicle and the prostate.  Sequential transverse (axial) scans were made in small increments beginning at the seminal vesicles and ending at the prostatic apex. Sequential longitudinal (saggital) scans were made in small increments beginning at the right lateral prostate and ending at the left lateral prostate. Excellent anatomical imaging was obtained. The peripheral, transitional, and central zones were well-defined. The seminal vesicles were normal.  Prostate volume 83.6 ml.  There were no hypoechoic areas. 12 needle core biopsies were performed. 1 biopsy each was taken from the following areas:  Right lateral base, right medial base, right lateral mid prostate, right medial mid prostate, right lateral apical prostate, right medial apical prostate, left lateral base, left medial base, left lateral mid prostate, left medial mid prostate, left lateral apical prostate, left medial apical prostate.. Minimal prostatic calcifications were noted. Excellent biopsy specimens were obtained.  Follow-up rectal examination was unremarkable.  The procedure was well-tolerated and without complications. Antibiotic instructions were given. The patient was told that:  For several days:  he should increase his fluid intake and limit strenuous activity  he might have mild discomfort at the base of his penis or in his rectum  he might have blood in his urine or blood in his bowel movements  For 2-3 months:  he might have blood in his ejaculate (semen)  Instructions were given to call the office immedicately for blood clots in the urine or bowel movements, difficulty urinating, inability to urinate, urinary retention, painful or frequent urination, fever, chills, nausea, vomiting, or other illness. The patient stated that he understood these instructions and would comply with them. We told the patient that prostate biopsy pathology reports are usually available within 3-5 working days, unless a pathologic second opinion is required, which may take 7-14 days. We told him to contact us to check on the status of his biopsy if he has not heard from [place] within 7 days. The patient left the ultrasound examination room in stable condition.

## 2022-11-09 ENCOUNTER — Ambulatory Visit: Payer: BC Managed Care – PPO | Admitting: Family Medicine

## 2022-11-09 VITALS — BP 120/81 | HR 66 | Temp 98.4°F | Ht 68.0 in | Wt 235.2 lb

## 2022-11-09 DIAGNOSIS — Z0000 Encounter for general adult medical examination without abnormal findings: Secondary | ICD-10-CM | POA: Diagnosis not present

## 2022-11-09 MED ORDER — TERBINAFINE HCL 1 % EX CREA: 1.0000 | TOPICAL_CREAM | Freq: Two times a day (BID) | CUTANEOUS | 0 refills | Status: AC

## 2022-11-09 NOTE — Assessment & Plan Note (Signed)
Declines vaccines today.  Has had recent labs.  Diabetic foot exam performed today.  Exam was notable for tinea pedis.  Medication was sent.  Patient will follow-up in 6 months.

## 2022-11-09 NOTE — Patient Instructions (Signed)
Medication as prescribed.  Follow up in 6 months.

## 2022-11-09 NOTE — Progress Notes (Signed)
Subjective:  Patient ID: Jason Norman, male    DOB: [birth year]  Age: 62 y.o. MRN: [id number]  CC: Chief Complaint  Patient presents with   Annual Exam    HPI:  62 year old male presents for an annual exam.  Patient states that he is doing well.  He has no complaints or concerns at this time.  Patient's preventative health care is not up-to-date.  He declines COVID-19 vaccination, also declines tetanus as well as flu vaccine.  He is in need of a foot exam today.  Patient denies chest pain or shortness of breath.  His blood pressure is well-controlled.  Fair control of diabetes.  Lipids have been at goal.  Patient Active Problem List   Diagnosis Date Noted   Annual physical exam 11/09/2022   GERD (gastroesophageal reflux disease) 04/03/2022   Diet-controlled type 2 diabetes mellitus (HCC) 04/03/2022   BPH (benign prostatic hyperplasia) 04/03/2022   Elevated PSA 04/03/2022   Hyperlipidemia 04/03/2022   Essential hypertension 04/02/2017    Social Hx   Social History   Socioeconomic History   Marital status: Single    Spouse name: Not on file   Number of children: Not on file   Years of education: Not on file   Highest education level: Not on file  Occupational History   Not on file  Tobacco Use   Smoking status: Never   Smokeless tobacco: Never  Substance and Sexual Activity   Alcohol use: Never   Drug use: Never   Sexual activity: Yes  Other Topics Concern   Not on file  Social History Narrative   Not on file   Social Determinants of Health   Financial Resource Strain: Low Risk  (09/11/2021)   Received from [facility], [facility]   Overall Financial Resource Strain (CARDIA)    Difficulty of Paying Living Expenses: Not hard at all  Food Insecurity: No Food Insecurity (09/11/2021)   Received from [facility], [facility]   Hunger Vital Sign    Worried About Running Out of Food in the Last Year: Never true    Ran Out of Food in the Last Year:  Never true  Transportation Needs: No Transportation Needs (06/27/2021)   Received from [facility], [facility]   [facility] - Transportation    Lack of Transportation (Medical): No    Lack of Transportation (Non-Medical): No  Physical Activity: Not on file  Stress: Not on file  Social Connections: Not on file    Review of Systems Per HPI  Objective:  BP 120/81   Pulse 66   Temp 98.4 F (36.9 C) (Oral)   Ht 5\' 8"  (1.727 m)   Wt 235 lb 3.2 oz (106.7 kg)   SpO2 94%   BMI 35.76 kg/m      11/09/2022    9:04 AM 10/03/2022    1:54 PM 10/03/2022    1:40 PM  BP/Weight  Systolic BP 120 126 141  Diastolic BP 81 78 88  Wt. (Lbs) 235.2    BMI 35.76 kg/m2      Physical Exam Vitals and nursing note reviewed.  Constitutional:      General: He is not in acute distress.    Appearance: Normal appearance.  HENT:     Head: Normocephalic and atraumatic.  Eyes:     General:        Right eye: No discharge.        Left eye: No  discharge.     Conjunctiva/sclera: Conjunctivae normal.  Cardiovascular:     Rate and Rhythm: Normal rate and regular rhythm.  Pulmonary:     Effort: Pulmonary effort is normal.     Breath sounds: Normal breath sounds. No wheezing, rhonchi or rales.  Neurological:     Mental Status: He is alert.  Psychiatric:        Mood and Affect: Mood normal.        Behavior: Behavior normal.     Lab Results  Component Value Date   WBC 4.5 10/02/2022   HGB 14.3 10/02/2022   HCT 42.5 10/02/2022   PLT 388 10/02/2022   GLUCOSE 162 (H) 10/02/2022   CHOL 118 10/02/2022   TRIG 163 (H) 10/02/2022   HDL 33 (L) 10/02/2022   LDLCALC 57 10/02/2022   ALT 22 10/02/2022   AST 16 10/02/2022   NA 142 10/02/2022   K 4.5 10/02/2022   CL 102 10/02/2022   CREATININE 1.18 10/02/2022   BUN 14 10/02/2022   CO2 25 10/02/2022   HGBA1C 7.2 (H) 10/02/2022     Assessment & Plan:   Problem List Items Addressed This Visit       Other   Annual physical exam - Primary     Declines vaccines today.  Has had recent labs.  Diabetic foot exam performed today.  Exam was notable for tinea pedis.  Medication was sent.  Patient will follow-up in 6 months.       Meds ordered this encounter  Medications   terbinafine (LAMISIL) 1 % cream    Sig: Apply 1 Application topically 2 (two) times daily. For 2 weeks.    Dispense:  30 g    Refill:  0    Follow-up:  6 months  Akiyah Eppolito Adriana Simas DO [facility]

## 2022-11-13 ENCOUNTER — Encounter (HOSPITAL_COMMUNITY): Payer: Self-pay

## 2022-11-13 ENCOUNTER — Other Ambulatory Visit: Payer: Self-pay | Admitting: Urology

## 2022-11-13 ENCOUNTER — Ambulatory Visit: Payer: BC Managed Care – PPO | Admitting: Urology

## 2022-11-13 ENCOUNTER — Ambulatory Visit (HOSPITAL_COMMUNITY)
Admission: RE | Admit: 2022-11-13 | Discharge: 2022-11-13 | Disposition: A | Discharge status: Home / Self Care | Payer: BC Managed Care – PPO | Source: Ambulatory Visit | Attending: Urology | Admitting: Urology

## 2022-11-13 DIAGNOSIS — D075 Carcinoma in situ of prostate: Secondary | ICD-10-CM | POA: Insufficient documentation

## 2022-11-13 DIAGNOSIS — R3912 Poor urinary stream: Secondary | ICD-10-CM | POA: Insufficient documentation

## 2022-11-13 DIAGNOSIS — R972 Elevated prostate specific antigen [PSA]: Secondary | ICD-10-CM | POA: Diagnosis not present

## 2022-11-13 DIAGNOSIS — R350 Frequency of micturition: Secondary | ICD-10-CM | POA: Diagnosis not present

## 2022-11-13 DIAGNOSIS — N401 Enlarged prostate with lower urinary tract symptoms: Secondary | ICD-10-CM | POA: Insufficient documentation

## 2022-11-13 DIAGNOSIS — N4231 Prostatic intraepithelial neoplasia: Secondary | ICD-10-CM | POA: Diagnosis not present

## 2022-11-13 MED ORDER — LIDOCAINE HCL (PF) 2 % IJ SOLN
10.0000 mL | Freq: Once | INTRAMUSCULAR | Status: AC
  Administered 2022-11-13: 10 mL

## 2022-11-13 MED ORDER — GENTAMICIN SULFATE 40 MG/ML IJ SOLN
INTRAMUSCULAR | Status: AC
  Filled 2022-11-13: qty 4

## 2022-11-13 MED ORDER — GENTAMICIN SULFATE 40 MG/ML IJ SOLN
160.0000 mg | Freq: Once | INTRAMUSCULAR | Status: AC
  Administered 2022-11-13: 160 mg via INTRAMUSCULAR

## 2022-11-13 MED ORDER — LIDOCAINE HCL (PF) 2 % IJ SOLN
INTRAMUSCULAR | Status: AC
  Filled 2022-11-13: qty 10

## 2022-11-13 MED ORDER — LIDOCAINE HCL (PF) 2 % IJ SOLN
INTRAMUSCULAR | Status: AC
  Filled 2022-11-13: qty 20

## 2022-11-13 NOTE — Progress Notes (Signed)
PT tolerated prostate biopsy procedure and antibiotic injection well today. Labs obtained and sent for pathology. PT ambulatory at discharge with no acute distress noted and verbalized understanding of discharge instructions.

## 2022-11-14 LAB — SURGICAL PATHOLOGY

## 2022-11-16 ENCOUNTER — Other Ambulatory Visit: Payer: Self-pay | Admitting: Urology

## 2022-11-16 DIAGNOSIS — R972 Elevated prostate specific antigen [PSA]: Secondary | ICD-10-CM

## 2022-11-16 DIAGNOSIS — N401 Enlarged prostate with lower urinary tract symptoms: Secondary | ICD-10-CM

## 2022-11-19 ENCOUNTER — Telehealth: Payer: Self-pay

## 2022-11-19 NOTE — Telephone Encounter (Signed)
Tried calling patient with no answer, left vm for return call to office

## 2022-11-19 NOTE — Telephone Encounter (Signed)
-----   Message from Bertram Millard Dahlstedt sent at 11/16/2022  9:17 AM EDT ----- Please call pt--great news--no cancer found on biopsies--only benign findings I would like to see him back in a year w/ psa--I put that order in. ----- Message ----- From: Interface, [facility] Sent: 11/14/2022  10:47 AM EDT To: Marcine Matar, MD

## 2022-11-20 NOTE — Telephone Encounter (Signed)
Patient is made aware and voiced understanding. 

## 2023-02-05 ENCOUNTER — Telehealth: Payer: Self-pay

## 2023-02-05 NOTE — Telephone Encounter (Signed)
Patient dropped off [employer] Form to be completed.  Call once completed.  Will need to pay $20.00

## 2023-03-06 NOTE — Telephone Encounter (Signed)
 Patient will return by end of week to pick up Paperwork.

## 2023-03-08 NOTE — Telephone Encounter (Signed)
I spoke with patient by phone and discussed we would give Dr. Retta Diones the paperwork to review. Patient voiced understanding that we would return call to patient once MD has reviewed and determined if paperwork can be completed.

## 2023-03-12 NOTE — Telephone Encounter (Signed)
Paperwork completed by Dr. Retta Diones and placed up front or patient to pick up. Receptionist made aware and will call patient to pick up and pay.

## 2023-03-26 ENCOUNTER — Ambulatory Visit: Payer: Self-pay | Admitting: Physician Assistant

## 2023-03-26 ENCOUNTER — Encounter: Payer: Self-pay | Admitting: Physician Assistant

## 2023-03-26 VITALS — BP 146/90 | HR 72 | Temp 98.9°F | Ht 68.0 in | Wt 249.0 lb

## 2023-03-26 DIAGNOSIS — J069 Acute upper respiratory infection, unspecified: Secondary | ICD-10-CM | POA: Diagnosis not present

## 2023-03-26 DIAGNOSIS — R051 Acute cough: Secondary | ICD-10-CM

## 2023-03-26 MED ORDER — PROMETHAZINE-DM 6.25-15 MG/5ML PO SYRP
5.0000 mL | ORAL_SOLUTION | Freq: Four times a day (QID) | ORAL | 0 refills | Status: AC | PRN
Start: 2023-03-26 — End: ?

## 2023-03-26 MED ORDER — ALBUTEROL SULFATE HFA 108 (90 BASE) MCG/ACT IN AERS
2.0000 | INHALATION_SPRAY | Freq: Four times a day (QID) | RESPIRATORY_TRACT | 0 refills | Status: AC | PRN
Start: 2023-03-26 — End: ?

## 2023-03-26 MED ORDER — PREDNISONE 20 MG PO TABS
40.0000 mg | ORAL_TABLET | Freq: Every day | ORAL | 0 refills | Status: AC
Start: 2023-03-26 — End: 2023-03-31

## 2023-03-26 NOTE — Progress Notes (Addendum)
 Acute Office Visit  Subjective:     Patient ID: Jason Norman, male    DOB: 11/10/1960, 63 y.o.   MRN: [id number]   HPI Patient is in today for cough: Patient complains of nonproductive cough.  Symptoms began 1 week ago.  The cough is non-productive, without wheezing, dyspnea or hemoptysis, with shortness of breath during the cough, chest is painful during coughing, improving over time and is aggravated by nothing Associated symptoms include:shortness of breath. Patient do not have new pets. Patient does not have a history of asthma. Patient does not have a history of environmental allergens. Patient does not have recent travel. Patient does not have a history of smoking. Patient  does not have previous Chest X-ray.    Review of Systems  Constitutional:  Negative for fever.  HENT:  Negative for congestion, ear pain and sore throat.   Respiratory:  Positive for cough and shortness of breath.   Cardiovascular:  Positive for chest pain (chest wall pain).  Neurological:  Negative for headaches.        Objective:     BP (!) 146/90   Pulse 72   Temp 98.9 F (37.2 C)   Ht 5' 8 (1.727 m)   Wt 249 lb (112.9 kg)   SpO2 97%   BMI 37.86 kg/m   Physical Exam Vitals reviewed.  Constitutional:      General: He is not in acute distress.    Appearance: Normal appearance.  HENT:     Nose: Nose normal.     Mouth/Throat:     Mouth: Mucous membranes are moist.     Pharynx: Oropharynx is clear.  Eyes:     Extraocular Movements: Extraocular movements intact.     Conjunctiva/sclera: Conjunctivae normal.  Cardiovascular:     Rate and Rhythm: Normal rate and regular rhythm.     Heart sounds: No murmur heard.    No friction rub. No gallop.  Pulmonary:     Effort: Pulmonary effort is normal.     Breath sounds: Normal breath sounds. No stridor. No wheezing, rhonchi or rales.  Musculoskeletal:        General: Normal range of motion.  Lymphadenopathy:     Cervical: No cervical  adenopathy.  Skin:    General: Skin is warm and dry.     Capillary Refill: Capillary refill takes less than 2 seconds.  Neurological:     General: No focal deficit present.     Mental Status: He is alert and oriented to person, place, and time.  Psychiatric:        Mood and Affect: Mood normal.        Behavior: Behavior normal.     No results found for any visits on 03/26/23.      Assessment & Plan:  Viral URI  Acute cough -     Albuterol  Sulfate HFA; Inhale 2 puffs into the lungs every 6 (six) hours as needed for wheezing or shortness of breath.  Dispense: 8 g; Refill: 0 -     Promethazine -DM; Take 5 mLs by mouth 4 (four) times daily as needed.  Dispense: 118 mL; Refill: 0 -     predniSONE ; Take 2 tablets (40 mg total) by mouth daily for 5 days.  Dispense: 10 tablet; Refill: 0   Patient appears stable today. Benign exam, lungs clear to auscultation bilaterally, shortness of breath only with long spells of cough, no indication for chest XR at this time. Likely self-resolving viral infection. Supportive  care reviewed with patient. Discussed with patient that there are no indications for antibiotics at this time, and viral respiratory illness can be persistent in duration. Prednisone  x 5 day, promethazine  DM, and albuterol  inhaler for cough. Tylenol or ibuprofen for pain as needed. May continue with OTC cold medications, Mucinex samples given today. Patient instructed to return to clinic if worsening shortness of breath, chest pain, hypoxia, or other concerns. Patient agreeable to plan.    Return if symptoms worsen or fail to improve.  Charmaine Jhoana Upham, PA-C

## 2023-11-04 ENCOUNTER — Other Ambulatory Visit: Payer: Self-pay | Admitting: Family Medicine

## 2023-11-20 ENCOUNTER — Other Ambulatory Visit: Payer: BC Managed Care – PPO

## 2023-11-24 NOTE — Progress Notes (Signed)
Risks, benefits, and some of the potential complications of a transrectal ultrasounds of the prostate (TRUSP) with biopsies were discussed at length with the patient including gross hematuria, blood in the bowel movements, hematospermia, bacteremia, infection, voiding discomfort, urinary retention, fever, chills, sepsis, blood transfusion, death, and others. All questions were answered. Informed consent was obtained. The patient confirmed that he had taken his pre-procedure antibiotic. All anticoagulants were discontinued prior to the procedure. The patient emptied his bladder. He was positioned in a comfortable left lateral decubitus position with hips and knees acutely flexed.  The rectal probe was inserted into the rectum without difficulty. 10cc of 2% Lidocaine without epinephrine was instilled with a spinal needle using ultrasound guidance near the junction of each seminal vesicle and the prostate.  Sequential transverse (axial) scans were made in small increments beginning at the seminal vesicles and ending at the prostatic apex. Sequential longitudinal (saggital) scans were made in small increments beginning at the right lateral prostate and ending at the left lateral prostate. Excellent anatomical imaging was obtained. The peripheral, transitional, and central zones were well-defined. The seminal vesicles were normal.  Prostate volume 83.6 ml.  There were no hypoechoic areas. 12 needle core biopsies were performed. 1 biopsy each was taken from the following areas:  Right lateral base, right medial base, right lateral mid prostate, right medial mid prostate, right lateral apical prostate, right medial apical prostate, left lateral base, left medial base, left lateral mid prostate, left medial mid prostate, left lateral apical prostate, left medial apical prostate.. Minimal prostatic calcifications were noted. Excellent biopsy specimens were obtained.  Follow-up rectal examination was unremarkable.  The procedure was well-tolerated and without complications. Antibiotic instructions were given. The patient was told that:  For several days:  he should increase his fluid intake and limit strenuous activity  he might have mild discomfort at the base of his penis or in his rectum  he might have blood in his urine or blood in his bowel movements  For 2-3 months:  he might have blood in his ejaculate (semen)  Instructions were given to call the office immedicately for blood clots in the urine or bowel movements, difficulty urinating, inability to urinate, urinary retention, painful or frequent urination, fever, chills, nausea, vomiting, or other illness. The patient stated that he understood these instructions and would comply with them. We told the patient that prostate biopsy pathology reports are usually available within 3-5 working days, unless a pathologic second opinion is required, which may take 7-14 days. We told him to contact us to check on the status of his biopsy if he has not heard from [place] within 7 days. The patient left the ultrasound examination room in stable condition.

## 2023-11-26 ENCOUNTER — Ambulatory Visit: Payer: BC Managed Care – PPO | Admitting: Urology

## 2023-11-26 VITALS — BP 157/92 | HR 75

## 2023-11-26 DIAGNOSIS — R972 Elevated prostate specific antigen [PSA]: Secondary | ICD-10-CM

## 2023-11-26 DIAGNOSIS — N401 Enlarged prostate with lower urinary tract symptoms: Secondary | ICD-10-CM | POA: Diagnosis not present

## 2023-11-27 ENCOUNTER — Ambulatory Visit: Payer: Self-pay | Admitting: Urology

## 2023-11-27 LAB — PSA: Prostate Specific Ag, Serum: 6.7 ng/mL — ABNORMAL HIGH (ref 0.0–4.0)

## 2023-12-05 ENCOUNTER — Other Ambulatory Visit: Payer: Self-pay

## 2023-12-05 DIAGNOSIS — R972 Elevated prostate specific antigen [PSA]: Secondary | ICD-10-CM

## 2024-05-27 ENCOUNTER — Other Ambulatory Visit
